# Patient Record
Sex: Male | Born: 1990 | Race: White | Hispanic: No | Marital: Married | Smoking: Never smoker
Health system: Southern US, Community
[De-identification: ages and names within clinical notes are randomized; demographics above are authoritative.]

---

## 2018-11-20 ENCOUNTER — Ambulatory Visit
Admission: RE | Admit: 2018-11-20 | Discharge: 2018-11-20 | Disposition: A | Discharge status: Home / Self Care | Payer: Self-pay | Source: Ambulatory Visit | Attending: Nurse Practitioner | Admitting: Nurse Practitioner

## 2018-11-20 ENCOUNTER — Other Ambulatory Visit: Payer: Self-pay | Admitting: Nurse Practitioner

## 2018-11-20 ENCOUNTER — Ambulatory Visit
Admission: RE | Admit: 2018-11-20 | Discharge: 2018-11-20 | Disposition: A | Discharge status: Home / Self Care | Payer: No Typology Code available for payment source | Source: Ambulatory Visit | Attending: Nurse Practitioner | Admitting: Nurse Practitioner

## 2018-11-20 ENCOUNTER — Other Ambulatory Visit: Payer: Self-pay

## 2018-11-20 DIAGNOSIS — Z021 Encounter for pre-employment examination: Secondary | ICD-10-CM

## 2020-05-05 IMAGING — CR CHEST  1 VIEW
1 series · 1 of 1 positions shown · non-contrast
Comparison: None.

CLINICAL DATA: Pre-employment.

EXAM:
CHEST  1 VIEW

[w chest pa]
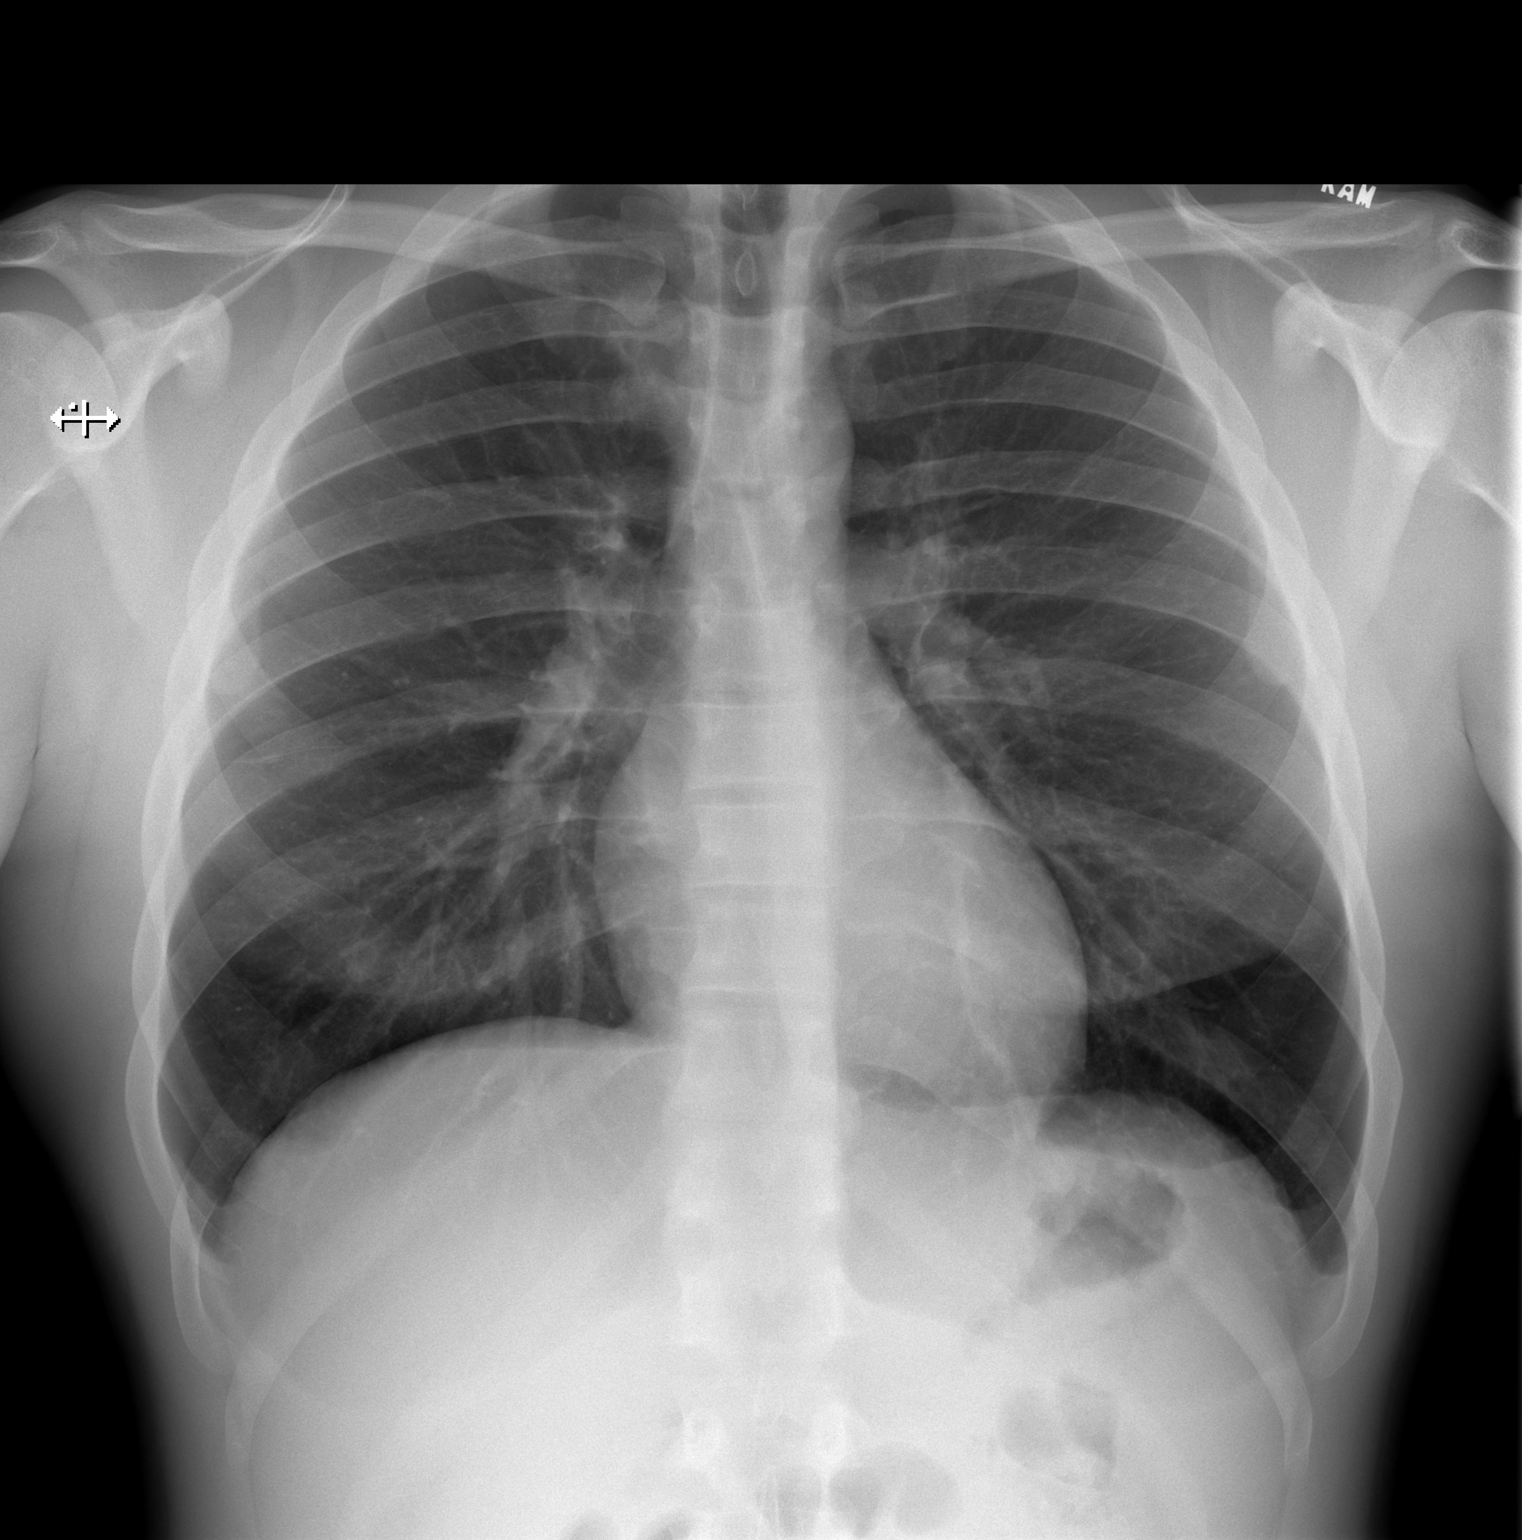

[1 of 1 positions shown; findings below may reference images not displayed]

FINDINGS: The heart size and mediastinal contours are within normal limits.
Both lungs are clear. The visualized skeletal structures are
unremarkable.
IMPRESSION: No active disease.

## 2021-09-14 ENCOUNTER — Inpatient Hospital Stay (HOSPITAL_COMMUNITY)
Admission: EM | Admit: 2021-09-14 | Discharge: 2021-09-16 | DRG: 683 | Disposition: A | Discharge status: Home / Self Care | Payer: 59 | Attending: Internal Medicine | Admitting: Internal Medicine

## 2021-09-14 ENCOUNTER — Other Ambulatory Visit: Payer: Self-pay

## 2021-09-14 DIAGNOSIS — M6282 Rhabdomyolysis: Secondary | ICD-10-CM | POA: Diagnosis not present

## 2021-09-14 DIAGNOSIS — N179 Acute kidney failure, unspecified: Secondary | ICD-10-CM | POA: Diagnosis not present

## 2021-09-14 DIAGNOSIS — R7989 Other specified abnormal findings of blood chemistry: Secondary | ICD-10-CM

## 2021-09-14 DIAGNOSIS — R531 Weakness: Secondary | ICD-10-CM

## 2021-09-14 LAB — CBC WITH DIFFERENTIAL/PLATELET
Abs Immature Granulocytes: 0.03 10*3/uL (ref 0.00–0.07)
Basophils Absolute: 0 10*3/uL (ref 0.0–0.1)
Basophils Relative: 0 %
Eosinophils Absolute: 0.2 10*3/uL (ref 0.0–0.5)
Eosinophils Relative: 2 %
HCT: 44.8 % (ref 39.0–52.0)
Hemoglobin: 15.6 g/dL (ref 13.0–17.0)
Immature Granulocytes: 0 %
Lymphocytes Relative: 21 %
Lymphs Abs: 1.6 10*3/uL (ref 0.7–4.0)
MCH: 31.1 pg (ref 26.0–34.0)
MCHC: 34.8 g/dL (ref 30.0–36.0)
MCV: 89.2 fL (ref 80.0–100.0)
Monocytes Absolute: 0.7 10*3/uL (ref 0.1–1.0)
Monocytes Relative: 9 %
Neutro Abs: 5.1 10*3/uL (ref 1.7–7.7)
Neutrophils Relative %: 68 %
Platelets: 174 10*3/uL (ref 150–400)
RBC: 5.02 MIL/uL (ref 4.22–5.81)
RDW: 11.9 % (ref 11.5–15.5)
WBC: 7.6 10*3/uL (ref 4.0–10.5)
nRBC: 0 % (ref 0.0–0.2)

## 2021-09-14 LAB — COMPREHENSIVE METABOLIC PANEL
ALT: 445 U/L — ABNORMAL HIGH (ref 0–44)
AST: 1321 U/L — ABNORMAL HIGH (ref 15–41)
Albumin: 4.2 g/dL (ref 3.5–5.0)
Alkaline Phosphatase: 59 U/L (ref 38–126)
Anion gap: 7 (ref 5–15)
BUN: 25 mg/dL — ABNORMAL HIGH (ref 6–20)
CO2: 26 mmol/L (ref 22–32)
Calcium: 9 mg/dL (ref 8.9–10.3)
Chloride: 107 mmol/L (ref 98–111)
Creatinine, Ser: 1.3 mg/dL — ABNORMAL HIGH (ref 0.61–1.24)
GFR, Estimated: >60 mL/min (ref >60)
Glucose, Bld: 103 mg/dL — ABNORMAL HIGH (ref 70–99)
Potassium: 4.3 mmol/L (ref 3.5–5.1)
Sodium: 140 mmol/L (ref 135–145)
Total Bilirubin: 0.5 mg/dL (ref 0.3–1.2)
Total Protein: 6.9 g/dL (ref 6.5–8.1)

## 2021-09-14 LAB — URINALYSIS, ROUTINE W REFLEX MICROSCOPIC
Bacteria, UA: NONE SEEN
Bilirubin Urine: NEGATIVE
Glucose, UA: NEGATIVE mg/dL
Ketones, ur: NEGATIVE mg/dL
Leukocytes,Ua: NEGATIVE
Nitrite: NEGATIVE
Protein, ur: 100 mg/dL — AB
Specific Gravity, Urine: 1.011 (ref 1.005–1.030)
pH: 7 (ref 5.0–8.0)

## 2021-09-14 LAB — CK: Total CK: 48772 U/L — ABNORMAL HIGH (ref 49–397)

## 2021-09-14 MED ORDER — SODIUM CHLORIDE 0.9 % IV BOLUS
2000.0000 mL | Freq: Once | INTRAVENOUS | Status: AC
  Administered 2021-09-14: 2000 mL via INTRAVENOUS

## 2021-09-14 MED ORDER — FENTANYL CITRATE PF 50 MCG/ML IJ SOSY
50.0000 ug | PREFILLED_SYRINGE | Freq: Once | INTRAMUSCULAR | Status: AC | PRN
  Administered 2021-09-15: 50 ug via INTRAVENOUS
  Filled 2021-09-14: qty 1

## 2021-09-14 NOTE — ED Provider Notes (Signed)
  [facility]-EMERGENCY DEPT Provider Note   CSN: [id number] Arrival date & time: 09/14/21  2135     History {Add pertinent medical, surgical, social history, OB history to HPI:1} Chief Complaint  Patient presents with  . Muscle Pain    Philip Lam is a 31 y.o. male.  HPI     Home Medications Prior to Admission medications   Not on File      Allergies    Patient has no allergy information on record.    Review of Systems   Review of Systems  Physical Exam Updated Vital Signs BP (!) 166/97   Pulse (!) 55   Temp 98 F (36.7 C) (Oral)   Resp 18   Ht 5\' 7"  (1.702 m)   Wt 90.7 kg   SpO2 98%   BMI 31.32 kg/m  Physical Exam  ED Results / Procedures / Treatments   Labs (all labs ordered are listed, but only abnormal results are displayed) Labs Reviewed  URINALYSIS, ROUTINE W REFLEX MICROSCOPIC - Abnormal; Notable for the following components:      Result Value   Hgb urine dipstick LARGE (*)    Protein, ur 100 (*)    All other components within normal limits  CK  COMPREHENSIVE METABOLIC PANEL  CBC WITH DIFFERENTIAL/PLATELET    EKG None  Radiology No results found.  Procedures Procedures  {Document cardiac monitor, telemetry assessment procedure when appropriate:1}  Medications Ordered in ED Medications  sodium chloride 0.9 % bolus 2,000 mL (has no administration in time range)    ED Course/ Medical Decision Making/ A&P                           Medical Decision Making Amount and/or Complexity of Data Reviewed Labs: ordered.   ***  {Document critical care time when appropriate:1} {Document review of labs and clinical decision tools ie heart score, Chads2Vasc2 etc:1}  {Document your independent review of radiology images, and any outside records:1} {Document your discussion with family members, caretakers, and with consultants:1} {Document social determinants of health affecting pt's care:1} {Document your decision  making why or why not admission, treatments were needed:1} Final Clinical Impression(s) / ED Diagnoses Final diagnoses:  None    Rx / DC Orders ED Discharge Orders     None

## 2021-09-14 NOTE — ED Triage Notes (Signed)
Pt reports with muscle soreness in his arms and back. Pt states that he had an intense workout Monday and he is unable to lift his arms. Pt also states that his urine is becoming darker with bubbles.

## 2021-09-14 NOTE — ED Notes (Signed)
Patient has a lavender in the main lab ?

## 2021-09-14 NOTE — ED Notes (Signed)
Called lab to check on CK and lab states specimen had to be diluted due to level being so elevated.

## 2021-09-15 ENCOUNTER — Encounter (HOSPITAL_COMMUNITY): Payer: Self-pay | Admitting: Internal Medicine

## 2021-09-15 DIAGNOSIS — T796XXA Traumatic ischemia of muscle, initial encounter: Secondary | ICD-10-CM | POA: Diagnosis not present

## 2021-09-15 DIAGNOSIS — N179 Acute kidney failure, unspecified: Principal | ICD-10-CM

## 2021-09-15 DIAGNOSIS — R531 Weakness: Secondary | ICD-10-CM

## 2021-09-15 DIAGNOSIS — M6282 Rhabdomyolysis: Secondary | ICD-10-CM | POA: Diagnosis present

## 2021-09-15 DIAGNOSIS — R7989 Other specified abnormal findings of blood chemistry: Secondary | ICD-10-CM | POA: Diagnosis not present

## 2021-09-15 LAB — COMPREHENSIVE METABOLIC PANEL
ALT: 398 U/L — ABNORMAL HIGH (ref 0–44)
AST: 1221 U/L — ABNORMAL HIGH (ref 15–41)
Albumin: 3.4 g/dL — ABNORMAL LOW (ref 3.5–5.0)
Alkaline Phosphatase: 46 U/L (ref 38–126)
Anion gap: 5 (ref 5–15)
BUN: 20 mg/dL (ref 6–20)
CO2: 23 mmol/L (ref 22–32)
Calcium: 8.4 mg/dL — ABNORMAL LOW (ref 8.9–10.3)
Chloride: 111 mmol/L (ref 98–111)
Creatinine, Ser: 1.12 mg/dL (ref 0.61–1.24)
GFR, Estimated: >60 mL/min (ref >60)
Glucose, Bld: 96 mg/dL (ref 70–99)
Potassium: 4.5 mmol/L (ref 3.5–5.1)
Sodium: 139 mmol/L (ref 135–145)
Total Bilirubin: 0.6 mg/dL (ref 0.3–1.2)
Total Protein: 5.6 g/dL — ABNORMAL LOW (ref 6.5–8.1)

## 2021-09-15 LAB — CBC WITH DIFFERENTIAL/PLATELET
Abs Immature Granulocytes: 0.01 10*3/uL (ref 0.00–0.07)
Basophils Absolute: 0 10*3/uL (ref 0.0–0.1)
Basophils Relative: 0 %
Eosinophils Absolute: 0.2 10*3/uL (ref 0.0–0.5)
Eosinophils Relative: 2 %
HCT: 43.6 % (ref 39.0–52.0)
Hemoglobin: 14.8 g/dL (ref 13.0–17.0)
Immature Granulocytes: 0 %
Lymphocytes Relative: 31 %
Lymphs Abs: 2.3 10*3/uL (ref 0.7–4.0)
MCH: 31.3 pg (ref 26.0–34.0)
MCHC: 33.9 g/dL (ref 30.0–36.0)
MCV: 92.2 fL (ref 80.0–100.0)
Monocytes Absolute: 0.7 10*3/uL (ref 0.1–1.0)
Monocytes Relative: 10 %
Neutro Abs: 4 10*3/uL (ref 1.7–7.7)
Neutrophils Relative %: 57 %
Platelets: 145 10*3/uL — ABNORMAL LOW (ref 150–400)
RBC: 4.73 MIL/uL (ref 4.22–5.81)
RDW: 11.9 % (ref 11.5–15.5)
WBC: 7.2 10*3/uL (ref 4.0–10.5)
nRBC: 0 % (ref 0.0–0.2)

## 2021-09-15 LAB — BLOOD GAS, VENOUS
Acid-Base Excess: 0.1 mmol/L (ref 0.0–2.0)
Bicarbonate: 26 mmol/L (ref 20.0–28.0)
O2 Saturation: 97 %
Patient temperature: 36.7
pCO2, Ven: 45 mmHg (ref 44–60)
pH, Ven: 7.36 (ref 7.25–7.43)
pO2, Ven: 74 mmHg — ABNORMAL HIGH (ref 32–45)

## 2021-09-15 LAB — MAGNESIUM
Magnesium: 2.4 mg/dL (ref 1.7–2.4)
Magnesium: 2.1 mg/dL (ref 1.7–2.4)

## 2021-09-15 LAB — PHOSPHORUS: Phosphorus: 4 mg/dL (ref 2.5–4.6)

## 2021-09-15 LAB — CK: Total CK: >50000 U/L — ABNORMAL HIGH (ref 49–397)

## 2021-09-15 MED ORDER — FENTANYL CITRATE PF 50 MCG/ML IJ SOSY
50.0000 ug | PREFILLED_SYRINGE | INTRAMUSCULAR | Status: DC | PRN
  Administered 2021-09-15 (×2): 50 ug via INTRAVENOUS
  Filled 2021-09-15 (×2): qty 1

## 2021-09-15 MED ORDER — ONDANSETRON HCL 4 MG/2ML IJ SOLN: 4.0000 mg | Freq: Four times a day (QID) | INTRAMUSCULAR | Status: DC | PRN

## 2021-09-15 MED ORDER — OXYCODONE HCL 5 MG PO TABS
5.0000 mg | ORAL_TABLET | ORAL | Status: DC | PRN
  Administered 2021-09-15: 5 mg via ORAL
  Filled 2021-09-15: qty 1

## 2021-09-15 MED ORDER — SODIUM CHLORIDE 0.9 % IV BOLUS
1000.0000 mL | Freq: Once | INTRAVENOUS | Status: AC
  Administered 2021-09-15: 1000 mL via INTRAVENOUS

## 2021-09-15 MED ORDER — LACTATED RINGERS IV SOLN: INTRAVENOUS | Status: DC

## 2021-09-15 MED ORDER — ACETAMINOPHEN 650 MG RE SUPP: 650.0000 mg | Freq: Four times a day (QID) | RECTAL | Status: DC | PRN

## 2021-09-15 MED ORDER — NALOXONE HCL 0.4 MG/ML IJ SOLN: 0.4000 mg | INTRAMUSCULAR | Status: DC | PRN

## 2021-09-15 MED ORDER — ACETAMINOPHEN 325 MG PO TABS
650.0000 mg | ORAL_TABLET | Freq: Four times a day (QID) | ORAL | Status: DC | PRN
  Administered 2021-09-15: 650 mg via ORAL
  Filled 2021-09-15: qty 2

## 2021-09-15 NOTE — Assessment & Plan Note (Signed)
 #)   Acute kidney injury: Presenting serum creatinine noted to be 1.30, which is suspected to represent acute kidney injury in the setting of presenting rhabdomyolysis, although chart review yields no prior serum creatinine data points for point of comparison or establishment of chronicity of this finding.  We will check urinalysis to evaluate for evidence of myoglobinuria, which would be consistent with her overall presentation of rhabdomyolysis, as above.  Plan: Aggressive IV fluids, as above.  Further evaluation management of rhabdomyolysis, as above.  Monitor strict I's and O's and daily weights.  Tempt avoid nephrotoxic agents.  Follow for results of urinalysis with microscopy.  Repeat CMP in the morning.

## 2021-09-15 NOTE — Assessment & Plan Note (Signed)
 #)   Generalized weakness: 1 to 2 days of generalized weakness, which appears consistent with presenting rhabdomyolysis, in the absence of any acute focal weakness, nor any objective weakness observed on physical exam.   Plan: Further evaluation management to have rhabdomyolysis, as above, including aggressive IV fluids, as above.  Physical therapy consults been placed.  Every 4 hours neurochecks ordered.

## 2021-09-15 NOTE — Evaluation (Signed)
Physical Therapy Evaluation Patient Details Name: ASHIR KUNZ MRN: [id number] DOB: 1991-02-26 Today's Date: 09/15/2021  History of Present Illness  31 y.o. male admitted 09/14/21 with BUE pain and dark urine following a rigorous workout. Presentation consistent with rhabdomyolysis with elevated creatinine and LFTs.  Clinical Impression  Pt is mobilizing well at an independent level. He ambulated 400' without an assistive device, no loss of balance, HR 75 walking, HR 64 at rest. He reports no pain at rest but does have pain at B distal biceps tendons with end range elbow extension. Encouraged pt to perform gentle AROM to B elbows and shoulders and to ambulate in the halls TID to minimize deconditioning during hospitalization. No further PT indicated, will sign off.      Recommendations for follow up therapy are one component of a multi-disciplinary discharge planning process, led by the attending physician.  Recommendations may be updated based on patient status, additional functional criteria and insurance authorization.  Follow Up Recommendations No PT follow up    Assistance Recommended at Discharge None  Patient can return home with the following       Equipment Recommendations None recommended by PT  Recommendations for Other Services       Functional Status Assessment Patient has not had a recent decline in their functional status     Precautions / Restrictions Precautions Precautions: None Restrictions Weight Bearing Restrictions: No      Mobility  Bed Mobility Overal bed mobility: Independent                  Transfers Overall transfer level: Independent                      Ambulation/Gait Ambulation/Gait assistance: Independent Gait Distance (Feet): 400 Feet Assistive device: None Gait Pattern/deviations: WFL(Within Functional Limits) Gait velocity: WNL     General Gait Details: steady, no loss of balance, HR 75 walking, HR 64 at  rest  Stairs            Wheelchair Mobility    Modified Rankin (Stroke Patients Only)       Balance Overall balance assessment: Independent                                           Pertinent Vitals/Pain Pain Assessment Pain Assessment: 0-10 Pain Score: 7  Pain Location: no pain at rest, 7/10 B distal biceps tendons with elbow extension end range of motion Pain Descriptors / Indicators: Sore Pain Intervention(s): Limited activity within patient's tolerance, Monitored during session    Home Living Family/patient expects to be discharged to:: Private residence Living Arrangements: Spouse/significant other               Home Equipment: None      Prior Function Prior Level of Function : Independent/Modified Independent             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 ADLs Comments: independent     Higher education careers adviser        Extremity/Trunk Assessment   Upper Extremity Assessment Upper Extremity Assessment: RUE deficits/detail;LUE deficits/detail RUE Deficits / Details: lacks ~15* full elbow extension AROM 2* pain at distal biceps tendon LUE Deficits / Details: lacks ~15* full elbow extension AROM 2* pain at distal biceps tendon    Lower Extremity Assessment Lower Extremity Assessment: Overall WFL for tasks assessed  Cervical / Trunk Assessment Cervical / Trunk Assessment: Normal  Communication   Communication: No difficulties  Cognition Arousal/Alertness: Awake/alert Behavior During Therapy: WFL for tasks assessed/performed Overall Cognitive Status: Within Functional Limits for tasks assessed                                          General Comments      Exercises     Assessment/Plan    PT Assessment Patient does not need any further PT services  PT Problem List         PT Treatment Interventions      PT Goals (Current goals can be found in the Care Plan section)  Acute Rehab PT Goals PT  Goal Formulation: All assessment and education complete, DC therapy    Frequency       Co-evaluation               AM-PAC PT "6 Clicks" Mobility  Outcome Measure Help needed turning from your back to your side while in a flat bed without using bedrails?: None Help needed moving from lying on your back to sitting on the side of a flat bed without using bedrails?: None Help needed moving to and from a bed to a chair (including a wheelchair)?: None Help needed standing up from a chair using your arms (e.g., wheelchair or bedside chair)?: None Help needed to walk in hospital room?: None Help needed climbing 3-5 steps with a railing? : None 6 Click Score: 24    End of Session   Activity Tolerance: Patient tolerated treatment well Patient left: with call bell/phone within reach (up walking in room) Nurse Communication: Mobility status      Time: 2355-7322 PT Time Calculation (min) (ACUTE ONLY): 9 min   Charges:   PT Evaluation $PT Eval Low Complexity: 1 Low         Tamala Ser PT 09/15/2021  Acute Rehabilitation Services Pager [phone] Office [phone]

## 2021-09-15 NOTE — Progress Notes (Signed)
  Transition of Care [facility]) Screening Note   Patient Details  Name: Philip Lam Date of Birth: 08/15/1990   Transition of Care [facility]) CM/SW Contact:    Lanier Clam, RN Phone Number: 09/15/2021, 1:17 PM    Transition of Care Department [facility]) has reviewed patient and no TOC needs have been identified at this time. We will continue to monitor patient advancement through interdisciplinary progression rounds. If new patient transition needs arise, please place a TOC consult.

## 2021-09-15 NOTE — H&P (Signed)
History and Physical    PLEASE NOTE THAT DRAGON DICTATION SOFTWARE WAS USED IN THE CONSTRUCTION OF THIS NOTE.   Philip Lam [id number] DOB: 05/20/1990 DOA: 09/14/2021  PCP: Pcp, No (will further assess) Patient coming from: home   I have personally briefly reviewed patient's old medical records in [place]  Chief Complaint: Bilateral upper extremity pain  HPI: Philip Lam is a 31 y.o. male with no significant reported past medical history who is admitted to [facility] on 09/14/2021 with rhabdomyolysis after presenting from home to [facility] ED complaining of bilateral upper extremity myalgias.   The patient reports 2 days of progressive bilateral lower extremity myalgias, involving the bilateral biceps and forearms.  He works as a Teaching laboratory technician, and notes that this occurred after a particularly rigorous workout on Monday, 09/12/2021.  He also notes some generalized weakness, in the absence of any acute focal weakness, and in the absence of any associated acute numbness or paresthesias.  Conveys that he is not on a statin medication as an outpatient.  Denies any use of recreational drugs.  No recent trauma.  Over the last day, he has noted new onset dark appearance of his urine in the absence of any acute dysuria.    ED Course:  Labs were notable for the following: Serum creatinine of 1.3, without any prior serum creatinine data point available for point comparison.  Total CPK level 48,000.  While in the ED, the following were administered: 3 L of IV fluid bolus  Subsequently, the patient was admitted for further evaluation management of rhabdomyolysis, complicated by suspected acute kidney injury.    Review of Systems: As per HPI otherwise 10 point review of systems negative.   History reviewed. No pertinent past medical history.  History reviewed. No pertinent surgical history.  Social History:  reports that he has never smoked. He has never used  smokeless tobacco. He reports that he does not drink alcohol and does not use drugs.   Not on File  History reviewed. No pertinent family history.    Outpatient medications: The patient denies any use of scheduled or prn prescription or over-the-counter medications or supplements as an outpatient.   Objective    Physical Exam: Vitals:   09/15/21 0022 09/15/21 0133 09/15/21 0225 09/15/21 0640  BP: (!) 147/88 (!) 152/91  139/72  Pulse: (!) 58 (!) 48  (!) 52  Resp: 18 18  18   Temp:  98.1 F (36.7 C)  98.2 F (36.8 C)  TempSrc:  Oral  Oral  SpO2: 97% 98%  99%  Weight:   96.8 kg   Height:   5\' 7"  (1.702 m)     General: appears to be stated age; alert, oriented Skin: warm, dry, no rash Head:  AT/[place] Mouth:  Oral mucosa membranes appear moist, normal dentition Neck: supple; trachea midline Heart:  RRR; did not appreciate any M/R/G Lungs: CTAB, did not appreciate any wheezes, rales, or rhonchi Abdomen: + BS; soft, ND, NT Vascular: 2+ pedal pulses b/l; 2+ radial pulses b/l Extremities: no peripheral edema, no muscle wasting Neuro: strength and sensation intact in upper and lower extremities b/l      Labs on Admission: I have personally reviewed following labs and imaging studies  CBC: Recent Labs  Lab 09/14/21 2241 09/15/21 0511  WBC 7.6 7.2  NEUTROABS 5.1 4.0  HGB 15.6 14.8  HCT 44.8 43.6  MCV 89.2 92.2  PLT 174 [id number]*   Basic Metabolic Panel: Recent Labs  Lab 09/14/21 2241 09/15/21 0031 09/15/21 0511  NA 140  --  139  K 4.3  --  4.5  CL 107  --  111  CO2 26  --  23  GLUCOSE 103*  --  96  BUN 25*  --  20  CREATININE 1.30*  --  1.12  CALCIUM 9.0  --  8.4*  MG  --  2.4 2.1  PHOS  --   --  4.0   GFR: Estimated Creatinine Clearance: 106.9 mL/min (by C-G formula based on SCr of 1.12 mg/dL). Liver Function Tests: Recent Labs  Lab 09/14/21 2241 09/15/21 0511  AST 1,321* 1,221*  ALT 445* 398*  ALKPHOS 59 46  BILITOT 0.5 0.6  PROT 6.9 5.6*  ALBUMIN  4.2 3.4*   No results for input(s): LIPASE, AMYLASE in the last 168 hours. No results for input(s): AMMONIA in the last 168 hours. Coagulation Profile: No results for input(s): INR, PROTIME in the last 168 hours. Cardiac Enzymes: Recent Labs  Lab 09/14/21 2159  CKTOTAL 48,772*   BNP (last 3 results) No results for input(s): PROBNP in the last 8760 hours. HbA1C: No results for input(s): HGBA1C in the last 72 hours. CBG: No results for input(s): GLUCAP in the last 168 hours. Lipid Profile: No results for input(s): CHOL, HDL, LDLCALC, TRIG, CHOLHDL, LDLDIRECT in the last 72 hours. Thyroid Function Tests: No results for input(s): TSH, T4TOTAL, FREET4, T3FREE, THYROIDAB in the last 72 hours. Anemia Panel: No results for input(s): VITAMINB12, FOLATE, FERRITIN, TIBC, IRON, RETICCTPCT in the last 72 hours. Urine analysis:    Component Value Date/Time   COLORURINE YELLOW 09/14/2021 2151   APPEARANCEUR CLEAR 09/14/2021 2151   LABSPEC 1.011 09/14/2021 2151   PHURINE 7.0 09/14/2021 2151   GLUCOSEU NEGATIVE 09/14/2021 2151   HGBUR LARGE (A) 09/14/2021 2151   BILIRUBINUR NEGATIVE 09/14/2021 2151   [place] 09/14/2021 2151   PROTEINUR 100 (A) 09/14/2021 2151   NITRITE NEGATIVE 09/14/2021 2151   LEUKOCYTESUR NEGATIVE 09/14/2021 2151    Radiological Exams on Admission: No results found.    Assessment/Plan    Principal Problem:   Rhabdomyolysis Active Problems:   AKI (acute kidney injury) ([place])   Generalized weakness      #) Rhabdomyolysis: Setting of presenting 2 days of progressive symmetrical myalgias in the bilateral upper extremities associated with generalized weakness, total CPK level noted to be 48,000, and associated with suspected acute kidney injury, as further detailed below.  Appears to be stemming from vigorous preceding workout on Monday, 09/12/2021.  Of note, not on a statin.  No evidence of acute focal weakness.   Plan: Aggressive IV fluids in the  form of lactated Ringer's at 200 cc/h.  Monitor strict I's and O's and daily weights.  Add on serum magnesium level check serum phosphorus level.  Further evaluation management of suspected acute kidney injury, as further detailed below.  Check VBG.  Every 4 hours neurochecks.  Repeat CPK level in the morning.  Prn IV fentanyl.        #) Generalized weakness: 1 to 2 days of generalized weakness, which appears consistent with presenting rhabdomyolysis, in the absence of any acute focal weakness, nor any objective weakness observed on physical exam.   Plan: Further evaluation management to have rhabdomyolysis, as above, including aggressive IV fluids, as above.  Physical therapy consults been placed.  Every 4 hours neurochecks ordered.        #) Acute kidney injury: Presenting serum creatinine noted to be  1.30, which is suspected to represent acute kidney injury in the setting of presenting rhabdomyolysis, although chart review yields no prior serum creatinine data points for point of comparison or establishment of chronicity of this finding.  We will check urinalysis to evaluate for evidence of myoglobinuria, which would be consistent with her overall presentation of rhabdomyolysis, as above.  Plan: Aggressive IV fluids, as above.  Further evaluation management of rhabdomyolysis, as above.  Monitor strict I's and O's and daily weights.  Tempt avoid nephrotoxic agents.  Follow for results of urinalysis with microscopy.  Repeat CMP in the morning.      DVT prophylaxis: SCD's   Code Status: Full code Family Communication: none Disposition Plan: Per Rounding Team Consults called: none;  Admission status: Observation   PLEASE NOTE THAT DRAGON DICTATION SOFTWARE WAS USED IN THE CONSTRUCTION OF THIS NOTE.   [place] DO [facility] [place]   09/15/2021, 6:51 AM

## 2021-09-15 NOTE — Progress Notes (Signed)
Patient admitted early this morning for rhabdomyolysis and has been started on IV fluids.  Patient seen and examined at bedside and plan of care discussed with him.  I have reviewed patient's medical records including this morning's H&P, current vitals, labs and medications myself.  Continue IV fluids.  Repeat a.m. CK.

## 2021-09-15 NOTE — Assessment & Plan Note (Signed)
 #)   Rhabdomyolysis: Setting of presenting 2 days of progressive symmetrical myalgias in the bilateral upper extremities associated with generalized weakness, total CPK level noted to be 48,000, and associated with suspected acute kidney injury, as further detailed below.  Appears to be stemming from vigorous preceding workout on Monday, 09/12/2021.  Of note, not on a statin.  No evidence of acute focal weakness.   Plan: Aggressive IV fluids in the form of lactated Ringer's at 200 cc/h.  Monitor strict I's and O's and daily weights.  Add on serum magnesium level check serum phosphorus level.  Further evaluation management of suspected acute kidney injury, as further detailed below.  Check VBG.  Every 4 hours neurochecks.  Repeat CPK level in the morning.  Prn IV fentanyl.

## 2021-09-16 DIAGNOSIS — R7989 Other specified abnormal findings of blood chemistry: Secondary | ICD-10-CM

## 2021-09-16 LAB — COMPREHENSIVE METABOLIC PANEL
ALT: 506 U/L — ABNORMAL HIGH (ref 0–44)
AST: 1259 U/L — ABNORMAL HIGH (ref 15–41)
Albumin: 3.5 g/dL (ref 3.5–5.0)
Alkaline Phosphatase: 44 U/L (ref 38–126)
Anion gap: 6 (ref 5–15)
BUN: 15 mg/dL (ref 6–20)
CO2: 26 mmol/L (ref 22–32)
Calcium: 8.4 mg/dL — ABNORMAL LOW (ref 8.9–10.3)
Chloride: 107 mmol/L (ref 98–111)
Creatinine, Ser: 1.05 mg/dL (ref 0.61–1.24)
GFR, Estimated: >60 mL/min (ref >60)
Glucose, Bld: 97 mg/dL (ref 70–99)
Potassium: 4.2 mmol/L (ref 3.5–5.1)
Sodium: 139 mmol/L (ref 135–145)
Total Bilirubin: 0.6 mg/dL (ref 0.3–1.2)
Total Protein: 5.7 g/dL — ABNORMAL LOW (ref 6.5–8.1)

## 2021-09-16 LAB — CK: Total CK: >50000 U/L — ABNORMAL HIGH (ref 49–397)

## 2021-09-16 LAB — MAGNESIUM: Magnesium: 2.1 mg/dL (ref 1.7–2.4)

## 2021-09-16 NOTE — Plan of Care (Signed)
  Problem: Education: Goal: Knowledge of General Education information will improve Description: Including pain rating scale, medication(s)/side effects and non-pharmacologic comfort measures Outcome: Adequate for Discharge   Problem: Clinical Measurements: Goal: Ability to maintain clinical measurements within normal limits will improve Outcome: Adequate for Discharge Goal: Diagnostic test results will improve Outcome: Adequate for Discharge Goal: Respiratory complications will improve Outcome: Adequate for Discharge Goal: Cardiovascular complication will be avoided Outcome: Adequate for Discharge   Problem: Safety: Goal: Ability to remain free from injury will improve Outcome: Adequate for Discharge

## 2021-09-16 NOTE — Discharge Summary (Signed)
Physician Discharge Summary  Philip Lam OVF:[id number] DOB: 07-16-1990 DOA: 09/14/2021  PCP: Pcp, No  Admit date: 09/14/2021 Discharge date: 09/16/2021  Admitted From: Home Disposition: Home  Recommendations for Outpatient Follow-up:  Follow up with PCP in 1 week with repeat CBC/CMP Follow up in ED if symptoms worsen or new appear   Home Health: No Equipment/Devices: None  Discharge Condition: Stable CODE STATUS: Full Diet recommendation: Regular Brief/Interim Summary: 31 year old male with no past medical history presented with bilateral upper extremity pain after rigorous workout.  On presentation, creatinine was 1.3 and CK total was more than 47,000 along with elevated LFTs.  He was started on IV fluids.  During the hospitalization, his condition is improved.  His urine color is improving and his muscle pain has resolved.  He adamantly wants to go home today although his CK level is more than 50,000 but is creatinine has improved.  He has been advised to continue copious fluid intake and avoid rigorous workout for at least another week.  He will be discharged home today with close outpatient follow-up with PCP with repeat CBC within a week and possibly CK total as well.  Discharge Diagnoses:   Rhabdomyolysis -Possibly from rigorous workout -CK total was more than 47,000 on presentation.  Treated with aggressive IV fluids. -During the hospitalization, his condition is improved.  His urine color is improving and his muscle pain has resolved.  He adamantly wants to go home today although his CK level is more than 50,000 but is creatinine has improved.  He has been advised to continue copious fluid intake and avoid rigorous workout for at least another week.  He will be discharged home today with close outpatient follow-up with PCP with repeat CBC within a week and possibly CK total as well.  Elevated LFTs -Possibly from above.  Still elevated.  Advised patient to have repeat CMP done  at PCPs office next week  Acute kidney injury -Resolved   Discharge Instructions  Discharge Instructions     Diet - low sodium heart healthy   Complete by: As directed    Increase activity slowly   Complete by: As directed       Allergies as of 09/16/2021   Not on File      Medication List    You have not been prescribed any medications.     Not on File  Consultations: None   Procedures/Studies: No results found.    Subjective: Patient seen and examined at bedside.  Feels much better and wants to go home today.  Upper extremity pain has resolved.  Urine color is improving.  No overnight fever, vomiting, abdominal pain reported.  Discharge Exam: Vitals:   09/15/21 2049 09/16/21 0618  BP: 123/69 121/73  Pulse: (!) 47 63  Resp: 16 16  Temp: 98.2 F (36.8 C) 98 F (36.7 C)  SpO2: 99% 99%    General: Pt is alert, awake, not in acute distress Cardiovascular: rate controlled currently, S1/S2 + Respiratory: bilateral decreased breath sounds at bases Abdominal: Soft, NT, ND, bowel sounds + Extremities: no edema, no cyanosis    The results of significant diagnostics from this hospitalization (including imaging, microbiology, ancillary and laboratory) are listed below for reference.     Microbiology: No results found for this or any previous visit (from the past 240 hour(s)).   Labs: BNP (last 3 results) No results for input(s): BNP in the last 8760 hours. Basic Metabolic Panel: Recent Labs  Lab 09/14/21 2241 09/15/21 0031  09/15/21 0511 09/16/21 0437  NA 140  --  139 139  K 4.3  --  4.5 4.2  CL 107  --  111 107  CO2 26  --  23 26  GLUCOSE 103*  --  96 97  BUN 25*  --  20 15  CREATININE 1.30*  --  1.12 1.05  CALCIUM 9.0  --  8.4* 8.4*  MG  --  2.4 2.1 2.1  PHOS  --   --  4.0  --    Liver Function Tests: Recent Labs  Lab 09/14/21 2241 09/15/21 0511 09/16/21 0437  AST 1,321* 1,221* 1,259*  ALT 445* 398* 506*  ALKPHOS 59 46 44  BILITOT  0.5 0.6 0.6  PROT 6.9 5.6* 5.7*  ALBUMIN 4.2 3.4* 3.5   No results for input(s): LIPASE, AMYLASE in the last 168 hours. No results for input(s): AMMONIA in the last 168 hours. CBC: Recent Labs  Lab 09/14/21 2241 09/15/21 0511  WBC 7.6 7.2  NEUTROABS 5.1 4.0  HGB 15.6 14.8  HCT 44.8 43.6  MCV 89.2 92.2  PLT 174 145*   Cardiac Enzymes: Recent Labs  Lab 09/14/21 2159 09/15/21 0511 09/16/21 0437  CKTOTAL 48,772* >50,000* >50,000*   BNP: Invalid input(s): POCBNP CBG: No results for input(s): GLUCAP in the last 168 hours. D-Dimer No results for input(s): DDIMER in the last 72 hours. Hgb A1c No results for input(s): HGBA1C in the last 72 hours. Lipid Profile No results for input(s): CHOL, HDL, LDLCALC, TRIG, CHOLHDL, LDLDIRECT in the last 72 hours. Thyroid function studies No results for input(s): TSH, T4TOTAL, T3FREE, THYROIDAB in the last 72 hours.  Invalid input(s): FREET3 Anemia work up No results for input(s): VITAMINB12, FOLATE, FERRITIN, TIBC, IRON, RETICCTPCT in the last 72 hours. Urinalysis    Component Value Date/Time   COLORURINE YELLOW 09/14/2021 2151   APPEARANCEUR CLEAR 09/14/2021 2151   LABSPEC 1.011 09/14/2021 2151   PHURINE 7.0 09/14/2021 2151   GLUCOSEU NEGATIVE 09/14/2021 2151   HGBUR LARGE (A) 09/14/2021 2151   BILIRUBINUR NEGATIVE 09/14/2021 2151   KETONESUR NEGATIVE 09/14/2021 2151   PROTEINUR 100 (A) 09/14/2021 2151   NITRITE NEGATIVE 09/14/2021 2151   LEUKOCYTESUR NEGATIVE 09/14/2021 2151   Sepsis Labs Invalid input(s): PROCALCITONIN,  WBC,  LACTICIDVEN Microbiology No results found for this or any previous visit (from the past 240 hour(s)).   Time coordinating discharge: 35 minutes  SIGNED:   Glade Lloyd, MD  [facility] 09/16/2021, 9:50 AM
